# Patient Record
Sex: Male | Born: 1980 | Race: Black or African American | Hispanic: No | Marital: Single
Health system: Southern US, Community
[De-identification: ages and names within clinical notes are randomized; demographics above are authoritative.]

---

## 2018-07-20 ENCOUNTER — Emergency Department (HOSPITAL_COMMUNITY): Payer: Self-pay

## 2018-07-20 ENCOUNTER — Emergency Department (HOSPITAL_COMMUNITY)
Admission: EM | Admit: 2018-07-20 | Discharge: 2018-07-20 | Disposition: A | Discharge status: Home / Self Care | Payer: Self-pay | Attending: Emergency Medicine | Admitting: Emergency Medicine

## 2018-07-20 ENCOUNTER — Encounter (HOSPITAL_COMMUNITY): Payer: Self-pay

## 2018-07-20 ENCOUNTER — Other Ambulatory Visit: Payer: Self-pay

## 2018-07-20 DIAGNOSIS — S92215A Nondisplaced fracture of cuboid bone of left foot, initial encounter for closed fracture: Secondary | ICD-10-CM

## 2018-07-20 DIAGNOSIS — W1842XA Slipping, tripping and stumbling without falling due to stepping into hole or opening, initial encounter: Secondary | ICD-10-CM | POA: Insufficient documentation

## 2018-07-20 DIAGNOSIS — Y929 Unspecified place or not applicable: Secondary | ICD-10-CM | POA: Insufficient documentation

## 2018-07-20 DIAGNOSIS — Y999 Unspecified external cause status: Secondary | ICD-10-CM | POA: Insufficient documentation

## 2018-07-20 DIAGNOSIS — Y939 Activity, unspecified: Secondary | ICD-10-CM | POA: Insufficient documentation

## 2018-07-20 MED ORDER — NAPROXEN 375 MG PO TABS: 375.0000 mg | ORAL_TABLET | Freq: Two times a day (BID) | ORAL | 0 refills | Status: AC

## 2018-07-20 MED ORDER — IBUPROFEN 800 MG PO TABS
800.0000 mg | ORAL_TABLET | Freq: Once | ORAL | Status: AC
  Administered 2018-07-20: 800 mg via ORAL
  Filled 2018-07-20: qty 1

## 2018-07-20 NOTE — ED Triage Notes (Addendum)
Pt BIB EMS from home. Pts left foot fell into a hole this morning and twisted his foot. Swelling and slight deformity noted. Pt has strong pedal pulses. Pt reports pain is radiating up his leg. Pt was tested for COVID and was told he was negative yesterday.   170/92 HR 90 RR 18 99% RA Temp 98.1

## 2018-07-20 NOTE — Consult Note (Addendum)
Reason for Consult: Left cuboid fracture Referring Physician: Francine Graven, DO   HPI: Patient is a history significant for smoking, here for evaluation of left foot injury.  The patient reports that he was in his yard walking when he stepped in a hole and twisted his left foot.  He describes an inversion type injury.  He notes an immediate sharp stabbing sensation in his left foot.  He points to his lateral midfoot as his source of pain.  He notes that he was unable to bear weight due to pain.  Nonweightbearing help to alleviate his symptoms.  He then presented to the [facility] emergency department for evaluation.  X-rays were obtained that demonstrate a nondisplaced intra-articular cuboid fracture.  Dr. Wylene Simmer was then consulted.  A CT scan was then ordered.  The patient reports that he has not ever injured this foot previously.  He denies any prior surgical interventions to his left foot.  He notes that his pain has not really improved.  He states that he lives alone.  He reports that he works at a [employer].  He has questions and concerns over the severity of his injury.  Past medical history: The patient admits to smoking 3-4 cigarettes daily.The patient denies cardiac related events, diabetes, thyroid disease, other autoimmune disorders, and cancers.2  History reviewed. No pertinent past medical history.  History reviewed. No pertinent surgical history.  History reviewed. No pertinent family history.  Social History:  has no history on file for tobacco, alcohol, and drug.  Allergies:  Allergies  Allergen Reactions  . Penicillins Rash    Did it involve swelling of the face/tongue/throat, SOB, or low BP? Yes Did it involve sudden or severe rash/hives, skin peeling, or any reaction on the inside of your mouth or nose? No Did you need to seek medical attention at a hospital or doctor's office? Yes When did it last happen?"about 5 years ago If all above answers are  "NO", may proceed with cephalosporin use.   . Shellfish Allergy Rash    Medications: I have reviewed the patient's current medications.  No results found for this or any previous visit (from the past 48 hour(s)).  Dg Ankle Complete Left  Result Date: 07/20/2018 CLINICAL DATA:  [place].  Injured left ankle. EXAM: LEFT ANKLE COMPLETE - 3+ VIEW COMPARISON:  None. FINDINGS: The ankle mortise is maintained. No acute ankle fracture is identified. No osteochondral lesion. No definite ankle joint effusion. Cuboid fracture is again demonstrated. IMPRESSION: 1. No definite ankle fractures. 2. Cuboid fracture suspected as seen on foot films. Electronically Signed   By: Marijo Sanes M.D.   On: 07/20/2018 11:24   Ct Foot Left Wo Contrast  Result Date: 07/20/2018 CLINICAL DATA:  History of cuboid fracture.  Foot pain. EXAM: CT OF THE LEFT FOOT WITHOUT CONTRAST TECHNIQUE: Multidetector CT imaging of the left foot was performed according to the standard protocol. Multiplanar CT image reconstructions were also generated. COMPARISON:  None. FINDINGS: Bones/Joint/Cartilage Severely comminuted cuboid fracture with fracture cleft involving the articular surface of the calcaneocuboid joint and 4/5 tarsometatarsal joints. Small nondisplaced fracture at the tip of the anterior process of the calcaneus. No other acute fracture or dislocation. Normal alignment. Lisfranc joint is in anatomic alignment. Ankle mortise is normal. Ligaments Ligaments are suboptimally evaluated by CT. Muscles and Tendons Muscles are normal. No muscle atrophy. No intramuscular fluid collection or hematoma. Flexor, extensor, peroneal and Achilles tendons are intact. Soft tissue No fluid collection or hematoma.  No soft tissue mass. IMPRESSION: 1. Severely comminuted cuboid fracture with fracture cleft involving the articular surface of the calcaneocuboid joint and 4/5 tarsometatarsal joints. 2. Small nondisplaced fracture at the tip of the anterior  process of the calcaneus. Electronically Signed   By: Elige KoHetal  Patel   On: 07/20/2018 13:35   Dg Foot Complete Left  Result Date: 07/20/2018 CLINICAL DATA:  Left foot pain and swelling. Twisting injury. Fell. EXAM: LEFT FOOT - COMPLETE 3+ VIEW COMPARISON:  None. FINDINGS: The joint spaces are maintained. No arthropathic findings. Findings suspicious for a nondisplaced fracture of the cuboid. No other definite foot fractures are identified. IMPRESSION: Nondisplaced intra-articular fracture of the cuboid. CT may be helpful for further evaluation. No other definite fractures are identified. Electronically Signed   By: Rudie MeyerP.  Gallerani M.D.   On: 07/20/2018 11:23    ROS:   Gen: Denies fever, chills, weight change, fatigue, night sweats MSK: patient complains of left lateral foot pain with any type of range of motion or attempt at weightbearing. Neuro: Denies headache, numbness, weakness, slurred speech, loss of memory or consciousness Derm: Denies rash, dry skin, scaling or peeling skin change HEENT: Denies blurred vision, double vision, neck stiffness, dysphagia PULM: Denies shortness of breath, cough, sputum production, hemoptysis, wheezing CV: Denies chest pain, edema, orthopnea, palpitations GI: Denies abdominal pain, nausea, vomiting, diarrhea, hematochezia, melena, constipation  Endocrine: Denies hot or cold intolerance, polyuria, polyphagia or appetite change Heme: Denies easy bruising, bleeding, bleeding gums  PE:  General: WDWN patient in NAD. Psych:  Appropriate mood and affect. Neuro:  A&O x 3, Moving all extremities, sensation intact to light touch HEENT:  EOMs intact Chest:  Even non-labored respirations Skin:  Incision C/D/I, no rashes or lesions Extremities: warm/dry, no edema, erythema or echymosis.  No lymphadenopathy. Pulses: Popliteus 2+ MSK:  Patient is guarded on exam.  Tender to palpation, localized over the cubiod.  Tender to palpation over the sinus tarsi.  ROM:  dorsiflexion 5 shy of neutral, plantar flexion 20, inversion and eversion are 0 15 respectively., ZOX:WRUEAVMT:unable to assess MMT due to pain. Patient is able to perform quad set, (-) Homan's  Blood pressure 128/75, pulse 67, temperature 98.9 F (37.2 C), temperature source Oral, resp. rate 16, SpO2 100 %.   Assessment/Plan: Nondisplaced L comminuted intraarticular cuboid fracture  Dr. Victorino DikeHewitt and I reviewed the plain films and CT scan.  For the time being the fracture pattern has maintained adequate anatomic alignment.  We will proceed with conservative treatment.  Plan to make the patient nonweightbearing in a posterior splint with side stirrups.  Follow-up in the office in 10-14 days for reassessment and repeat weightbearing plain films.  I spoke with Arthor CaptainAbigail Harris, PA-C in regards to this plan.  I did briefly discuss the adverse affects of smoking as it pertains to bone healing.  Alfredo MartinezJustin Fronie Holstein PA-C [facility] Office:  [phone]

## 2018-07-20 NOTE — ED Notes (Signed)
Patient transported to X-ray 

## 2018-07-20 NOTE — ED Notes (Signed)
Bed: KF84 Expected date:  Expected time:  Means of arrival:  Comments: EMS deformity/fall

## 2018-07-20 NOTE — ED Notes (Signed)
Patient transported to CT 

## 2018-07-20 NOTE — Discharge Instructions (Addendum)
Wylene Simmer, MD [facility]  Please read the following information regarding your care.  Medications   Weight Bearing X Do not bear any weight on the injured leg or foot.  Cast / Splint / Dressing X Keep your splint, cast or dressing clean and dry.  Dont put anything (coat hanger, pencil, etc) down inside of it.  If it gets damp, use a hair dryer on the cool setting to dry it.  If it gets soaked, call the office to schedule an appointment for a cast change.   Swelling It is normal for you to have swelling where you had injury.  To reduce swelling and pain, keep your toes above your nose for at least 3 days.  It may be necessary to keep your foot or leg elevated for several weeks.  If it hurts, it should be elevated.  Follow Up Call my office at [phone] when you are discharged from the hospital to schedule an appointment to be seen in 10 - 14 days.

## 2018-07-20 NOTE — ED Provider Notes (Signed)
[facility] DEPT Provider Note   CSN: [id number] Arrival date & time: 07/20/18  1015     History   Chief Complaint Chief Complaint  Patient presents with  . Foot Injury    HPI Jeremiah Wilkinson is a 38 y.o. male who presents emergency department chief complaint of left foot pain.  Patient was ambulating this morning when he accidentally stepped in a hole. Patient had an inversion injury to the left foot and ankle.  He had immediate severe pain on the dorsum of the left foot.  He is unable to bear weight immediately after the injury.  He has had swelling and pain to the area.  No obvious deformities.  He denies numbness or tingling.  He has no previous injuries to the affected ankle.     HPI  History reviewed. No pertinent past medical history.  There are no active problems to display for this patient.   History reviewed. No pertinent surgical history.      Home Medications    Prior to Admission medications   Not on File    Family History History reviewed. No pertinent family history.  Social History Social History   Tobacco Use  . Smoking status: Not on file  Substance Use Topics  . Alcohol use: Not on file  . Drug use: Not on file     Allergies   Penicillins and Shellfish allergy   Review of Systems Review of Systems Ten systems reviewed and are negative for acute change, except as noted in the HPI.    Physical Exam Updated Vital Signs BP 128/75 (BP Location: Left Arm)   Pulse 67   Temp 98.9 F (37.2 C) (Oral)   Resp 16   SpO2 100%   Physical Exam  Physical Exam  Nursing note and vitals reviewed. Constitutional: He appears well-developed and well-nourished. No distress.  HENT:  Head: Normocephalic and atraumatic.  Eyes: Conjunctivae normal are normal. No scleral icterus.  Neck: Normal range of motion. Neck supple.  Cardiovascular: Normal rate, regular rhythm and normal heart sounds.   Pulmonary/Chest: Effort  normal and breath sounds normal. No respiratory distress.  Abdominal: Soft. There is no tenderness.  Musculoskeletal: Left foot and ankle exam shows swelling over the proximal lateral portion of the foot on the dorsal surface.  Is exquisitely tender to palpation.  No swelling over the medial or lateral malleolus.  He has normal DP and PT pulses.  No swelling or posterior tibial pain.  No pain along the entire surface of the foot, no bruising along the plantar surface.  Full range of motion of the toes, pain with movement of the ankle. Neurological: He is alert.  Skin: Skin is warm and dry. He is not diaphoretic.  Psychiatric: His behavior is normal.    ED Treatments / Results  Labs (all labs ordered are listed, but only abnormal results are displayed) Labs Reviewed - No data to display  EKG    Radiology Dg Ankle Complete Left  Result Date: 07/20/2018 CLINICAL DATA:  [place] [address].  Injured left ankle. EXAM: LEFT ANKLE COMPLETE - 3+ VIEW COMPARISON:  None. FINDINGS: The ankle mortise is maintained. No acute ankle fracture is identified. No osteochondral lesion. No definite ankle joint effusion. Cuboid fracture is again demonstrated. IMPRESSION: 1. No definite ankle fractures. 2. Cuboid fracture suspected as seen on foot films. Electronically Signed   By: Marijo Sanes M.D.   On: 07/20/2018 11:24   Dg Foot Complete Left  Result Date: 07/20/2018  CLINICAL DATA:  Left foot pain and swelling. Twisting injury. Fell. EXAM: LEFT FOOT - COMPLETE 3+ VIEW COMPARISON:  None. FINDINGS: The joint spaces are maintained. No arthropathic findings. Findings suspicious for a nondisplaced fracture of the cuboid. No other definite foot fractures are identified. IMPRESSION: Nondisplaced intra-articular fracture of the cuboid. CT may be helpful for further evaluation. No other definite fractures are identified. Electronically Signed   By: Rudie MeyerP.  Gallerani M.D.   On: 07/20/2018 11:23    Procedures Procedures (including  critical care time) SPLINT APPLICATION Date/Time: 4:15 PM Authorized by: Arthor CaptainAbigail Terrence Wishon Consent: Verbal consent obtained. Risks and benefits: risks, benefits and alternatives were discussed Consent given by: patient Splint applied by: orthopedic technician Location details: Left leg Splint type: Short leg (stirrup and posterior) Supplies used: Ortho glass Post-procedure: The splinted body part was neurovascularly unchanged following the procedure. Patient tolerance: Patient tolerated the procedure well with no immediate complications.    Medications Ordered in ED Medications - No data to display   Initial Impression / Assessment and Plan / ED Course  I have reviewed the triage vital signs and the nursing notes.  Pertinent labs & imaging results that were available during my care of the patient were reviewed by me and considered in my medical decision making (see chart for details).        38 year old male with left foot injury.  I have reviewed the patient's plain film of the left foot and ankle which shows a cuboid fracture which is intra-articular.  I spoke with Dr. Victorino DikeHewitt who asked that I get a CT.  CT reviewed by myself as well as Alfredo MartinezJustin Ollis PA for Dr. Victorino DikeHewitt who has seen the patient here in the emergency department and consult.  The patient will be placed in a short leg splint.  He is to maintain nonweightbearing.  He was given crutches.  Prescription for anti-inflammatories.  He is advised to follow-up with Dr. Victorino DikeHewitt in his office in 14 days.  I discussed return precautions.  Home care.  He appears appropriate for discharge at this time.  Final Clinical Impressions(s) / ED Diagnoses   Final diagnoses:  None    ED Discharge Orders    None       Arthor CaptainHarris, Bevely Hackbart, PA-C 07/20/18 1615    Samuel JesterMcManus, Kathleen, DO 07/23/18 1110

## 2020-08-08 IMAGING — CR LEFT FOOT - COMPLETE 3+ VIEW
3 series · 3 of 3 positions shown · non-contrast
Comparison: None.

CLINICAL DATA: Left foot pain and swelling. Twisting injury. Fell.

EXAM:
LEFT FOOT - COMPLETE 3+ VIEW

[x foot lat left]
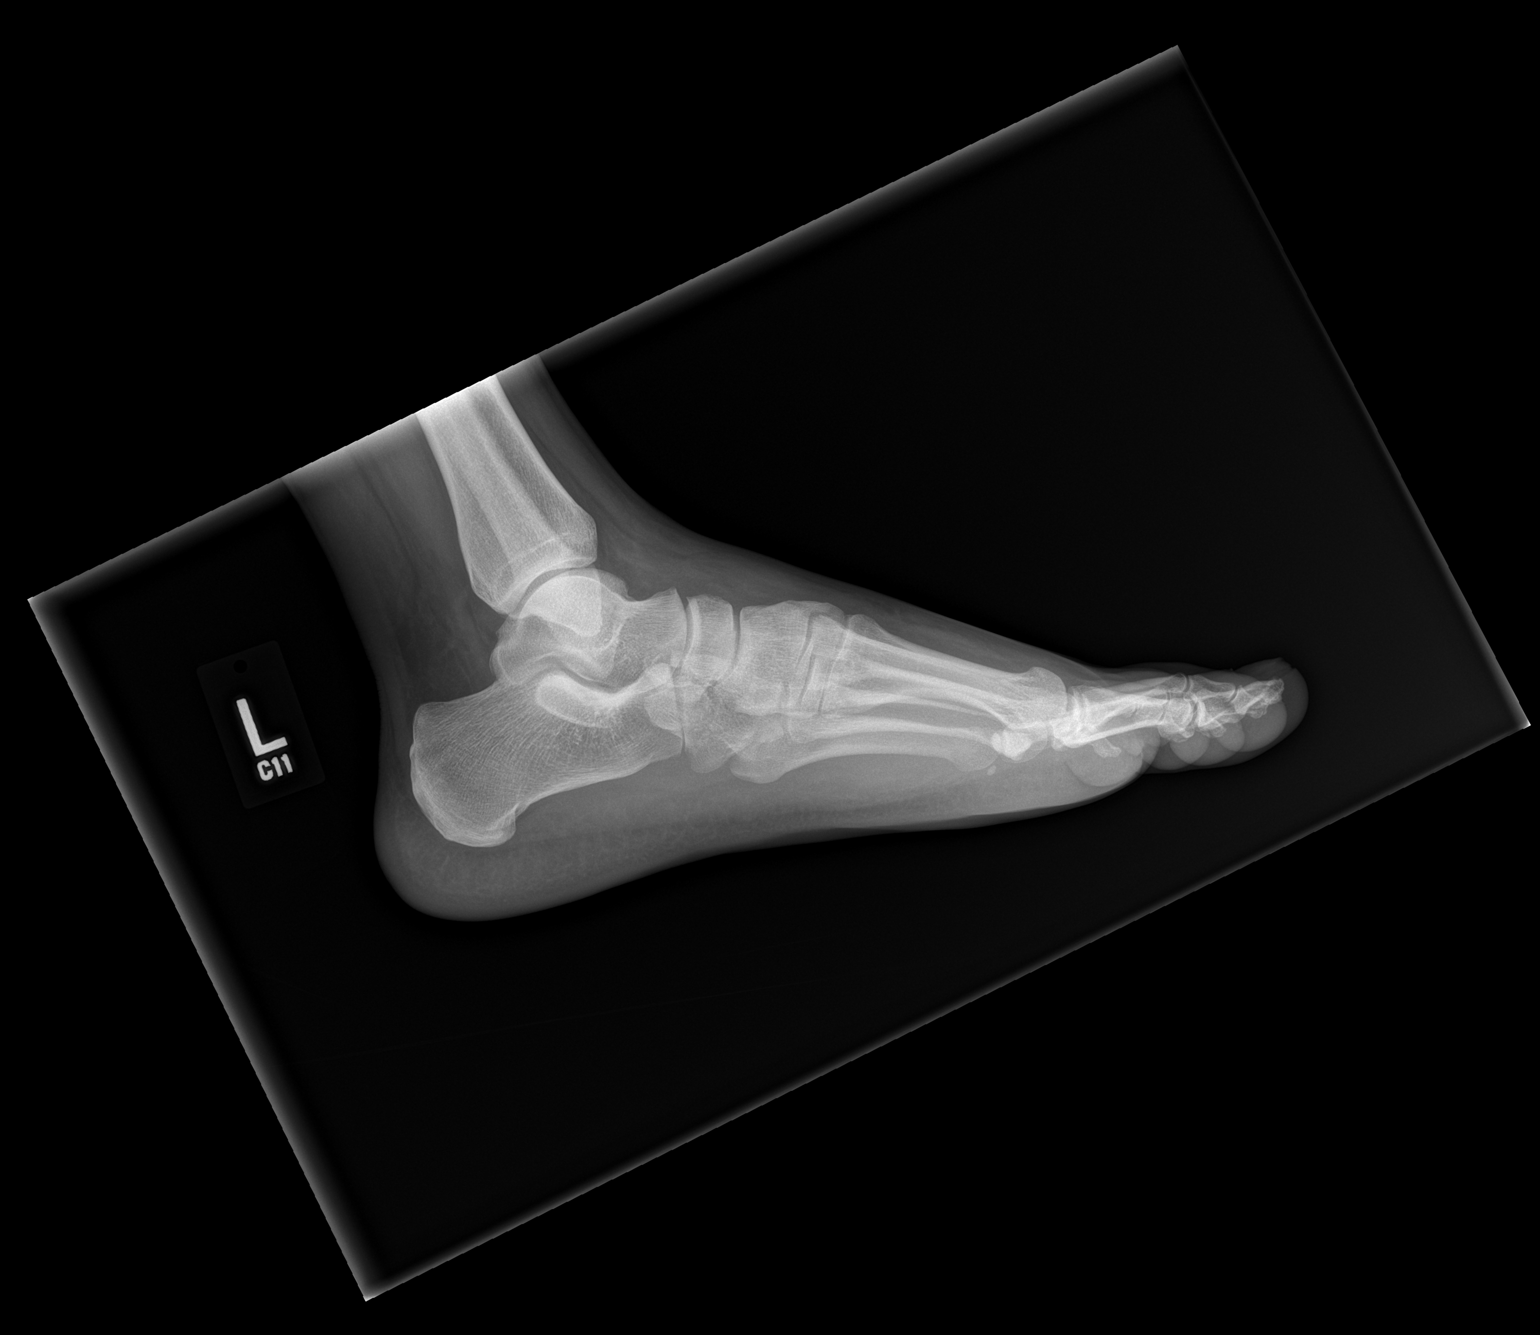

[x foot ap left]
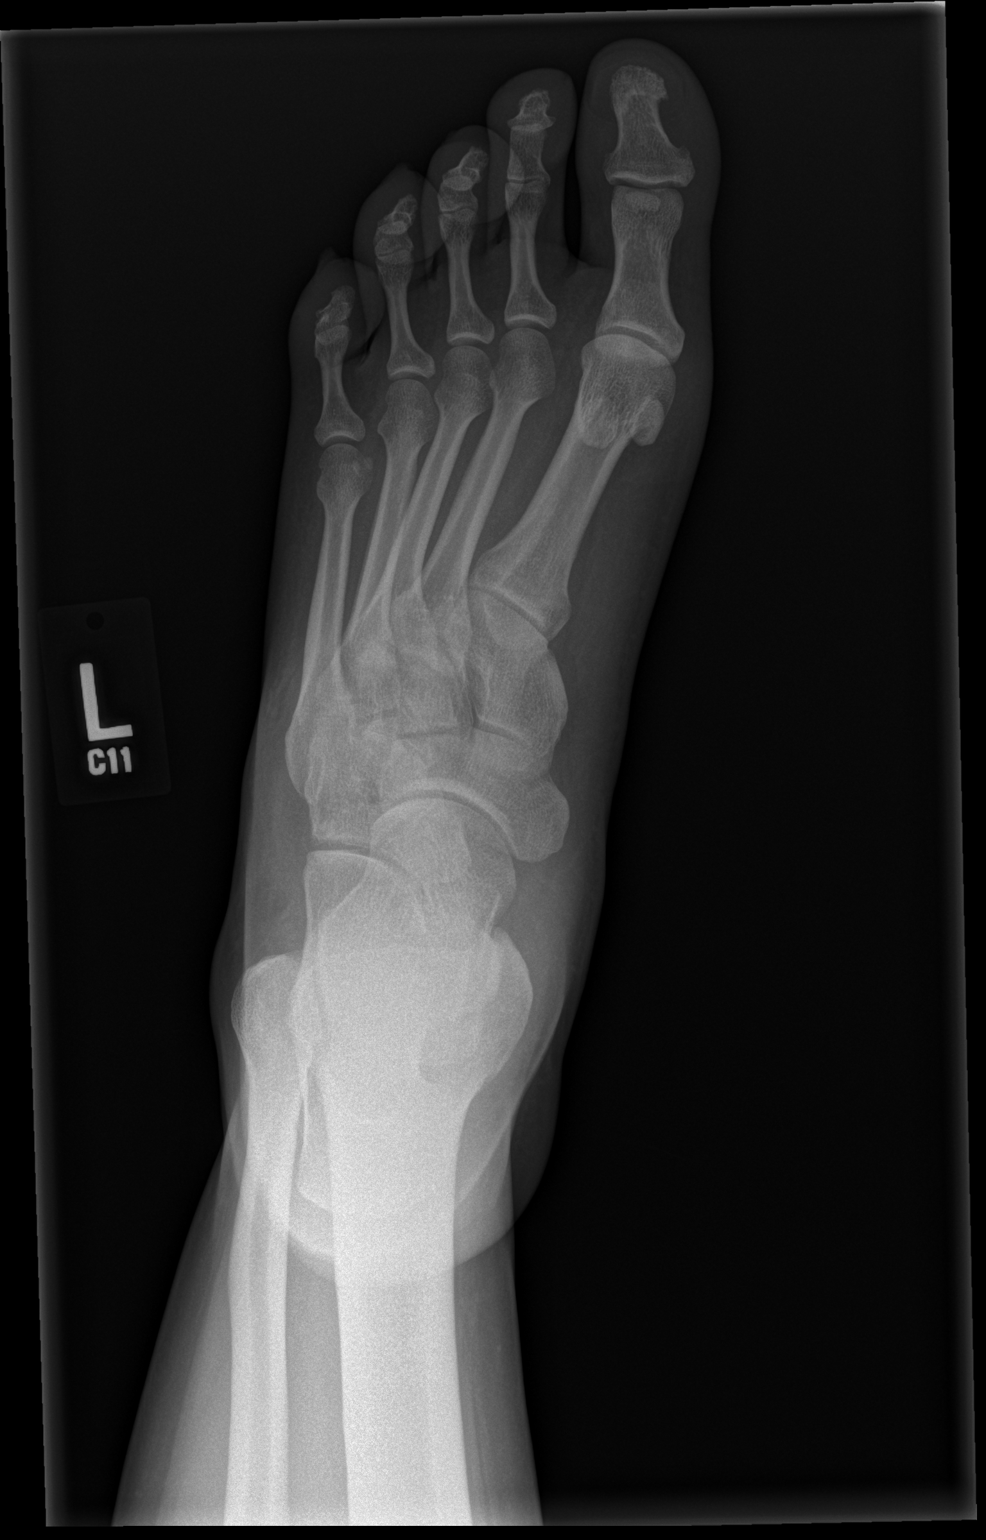

[x foot obl left]
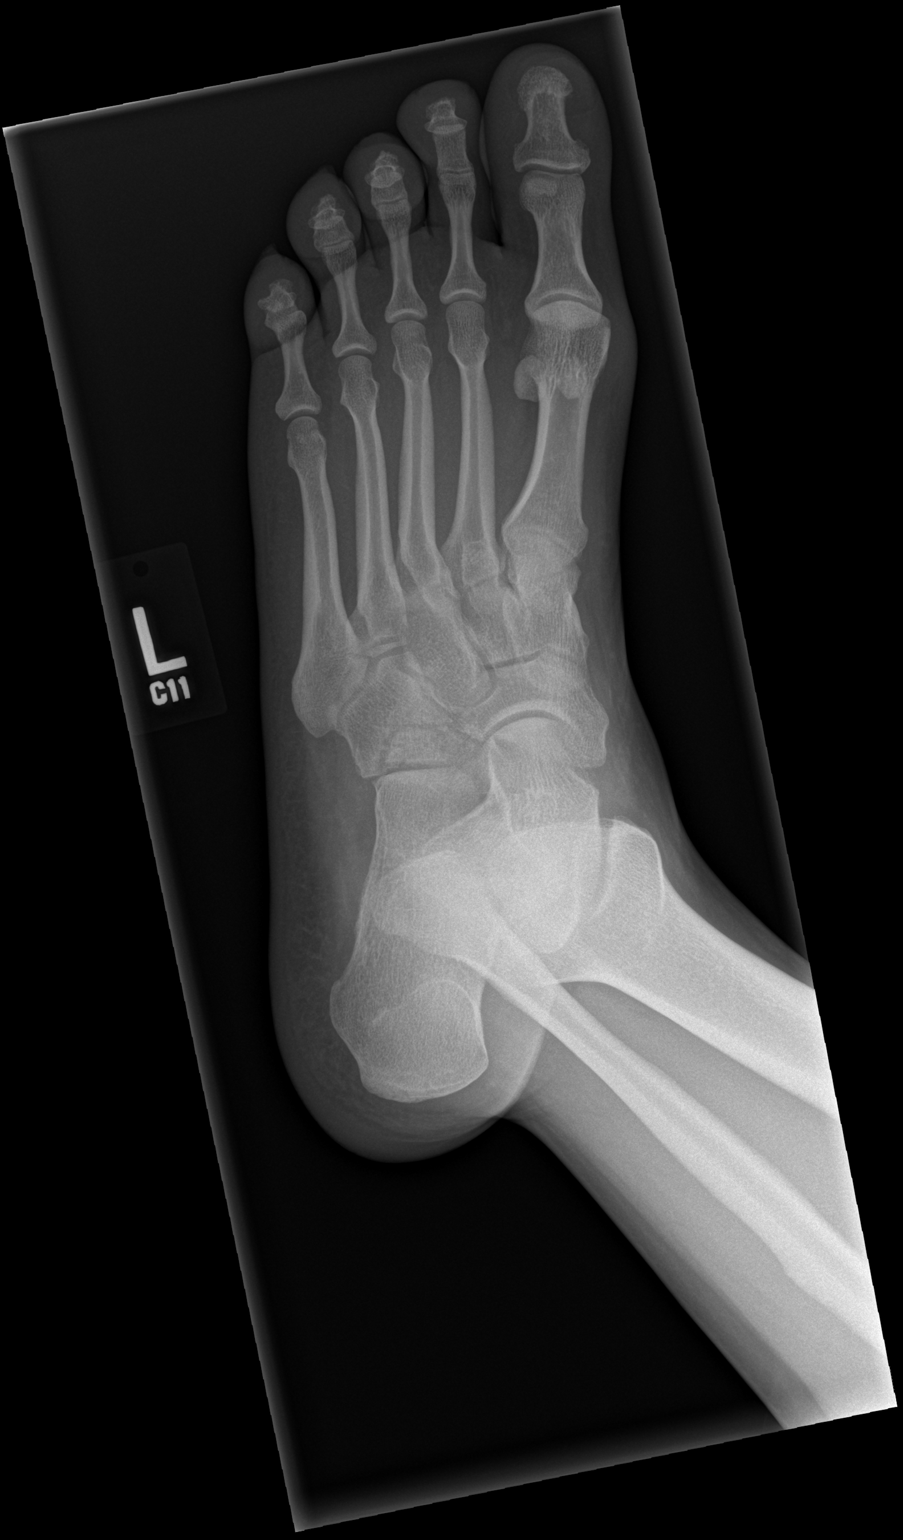

[3 of 3 positions shown; findings below may reference images not displayed]

FINDINGS: The joint spaces are maintained. No arthropathic findings. Findings
suspicious for a nondisplaced fracture of the cuboid. No other
definite foot fractures are identified.
IMPRESSION: Nondisplaced intra-articular fracture of the cuboid. CT may be
helpful for further evaluation. No other definite fractures are
identified.

## 2020-08-08 IMAGING — CT CT OF THE LEFT FOOT WITHOUT CONTRAST
3 series · 12 of 34 positions shown, 14 images · non-contrast
Comparison: None.

CLINICAL DATA: History of cuboid fracture.  Foot pain.

EXAM:
CT OF THE LEFT FOOT WITHOUT CONTRAST
TECHNIQUE: Multidetector CT imaging of the left foot was performed according to
the standard protocol. Multiplanar CT image reconstructions were
also generated.

[Series 4: axial st · axial · 0.55mm/px · z∈[+390,+526]mm · 4 of 133 slices shown, 5 images]
[im 21/133  soft-tissue]
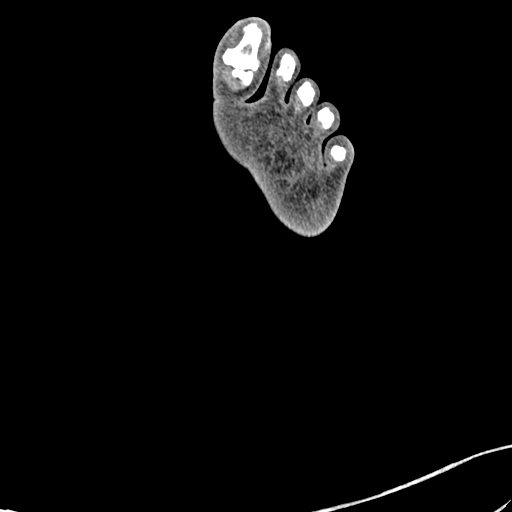
[im 21/133  bone]
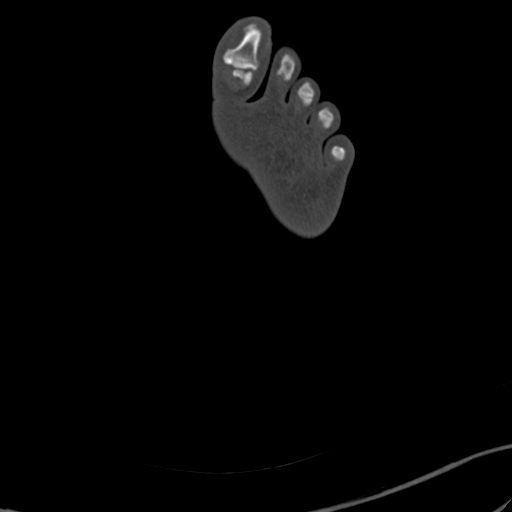
[im 51/133  bone]
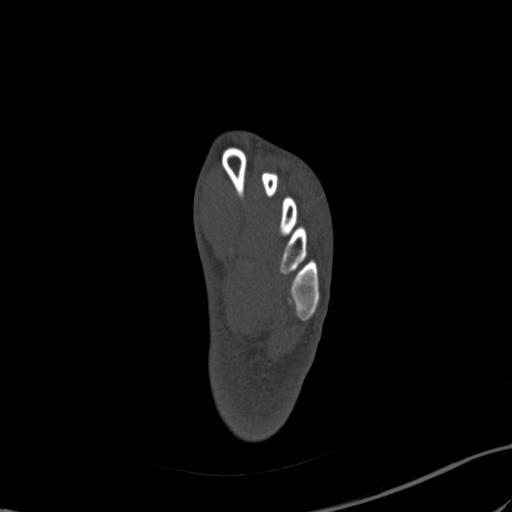
[im 82/133  bone]
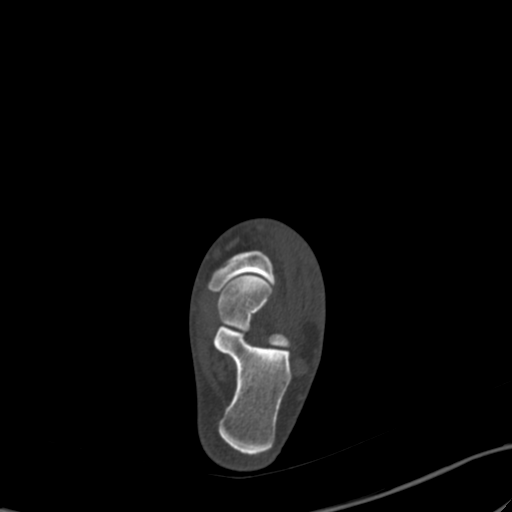
[im 112/133  bone]
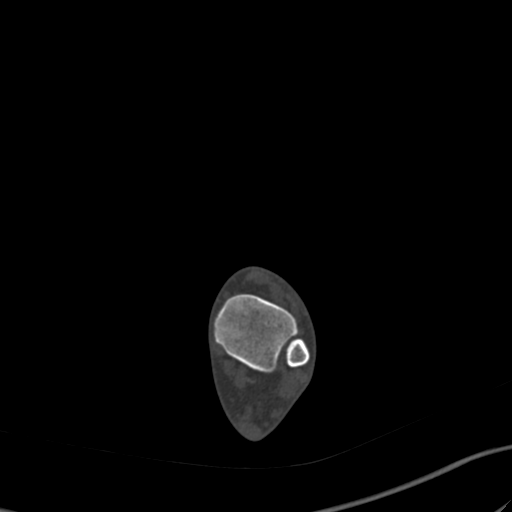

[Series 7: coronal bone · coronal · 0.34mm/px · 3 of 157 slices shown]
[im 64/157  bone]
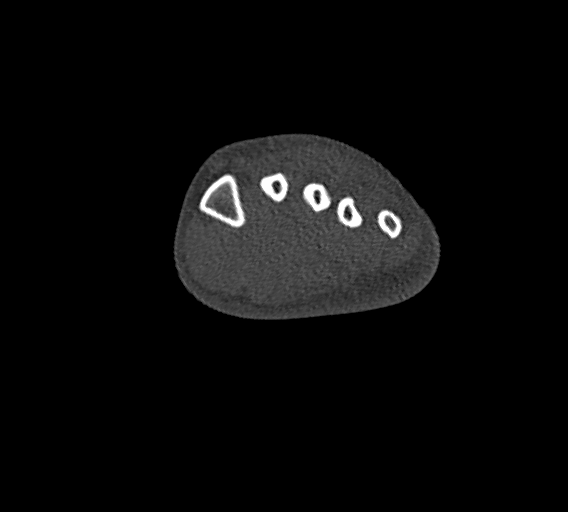
[im 74/157  bone]
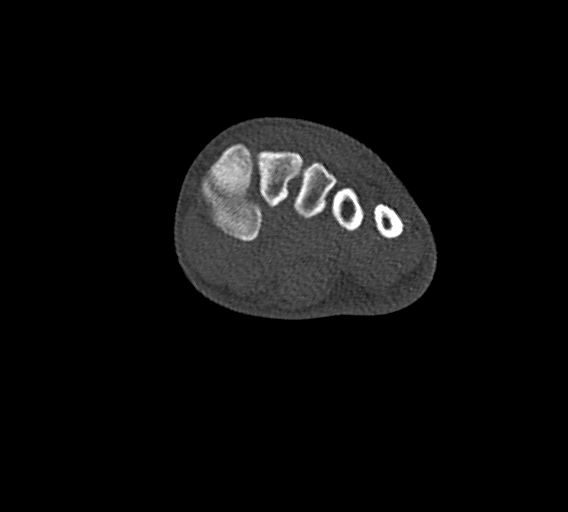
[im 83/157  bone]
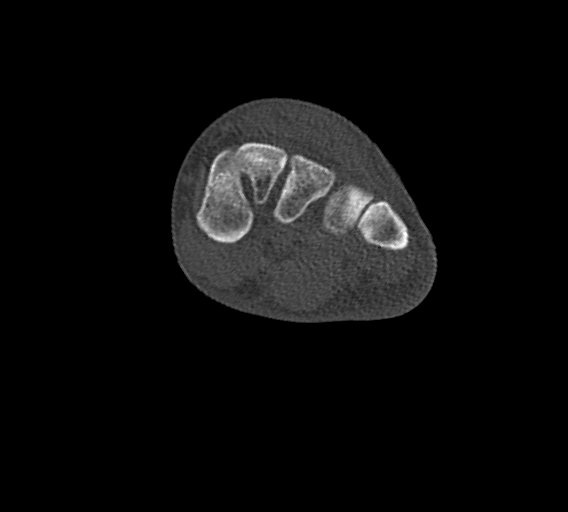

[Series 8: sagittal bone · sagittal · 0.34mm/px · 5 of 97 slices shown, 6 images]
[im 37/97  bone]
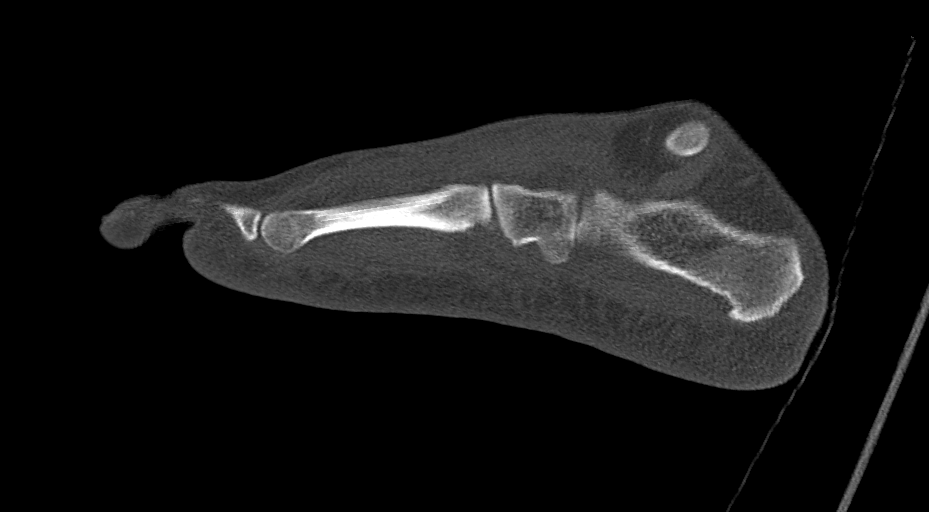
[im 43/97  bone]
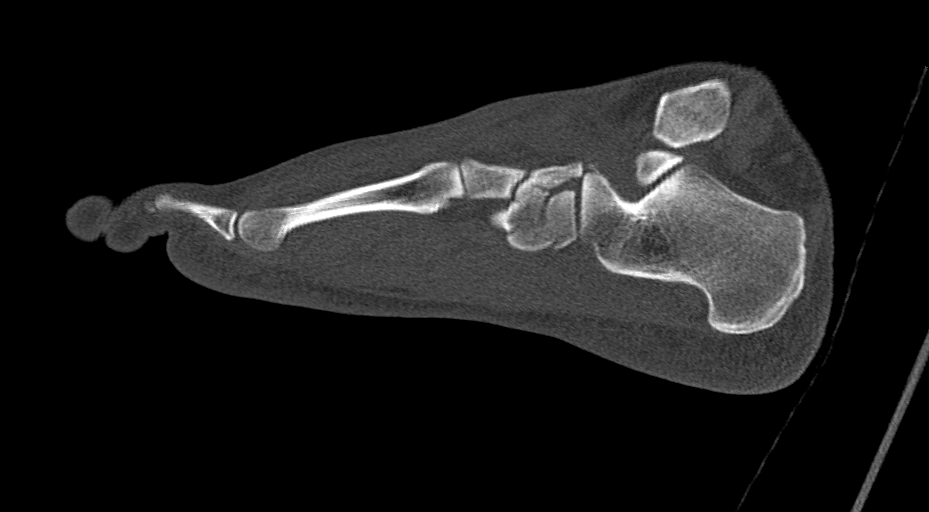
[im 49/97  soft-tissue]
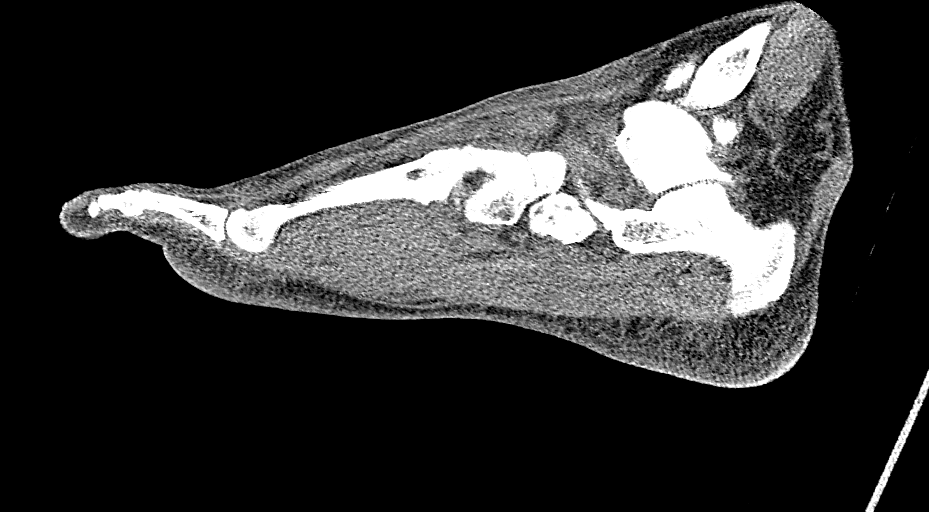
[im 49/97  bone]
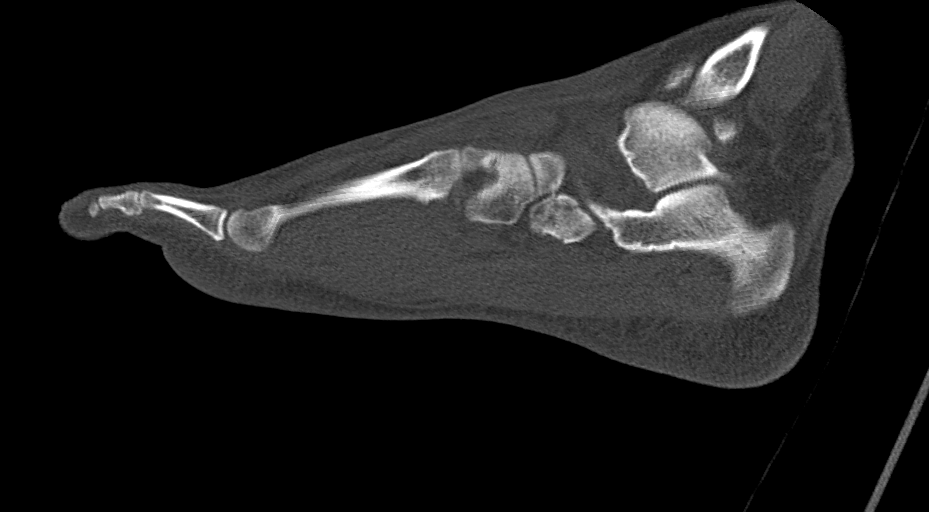
[im 54/97  bone]
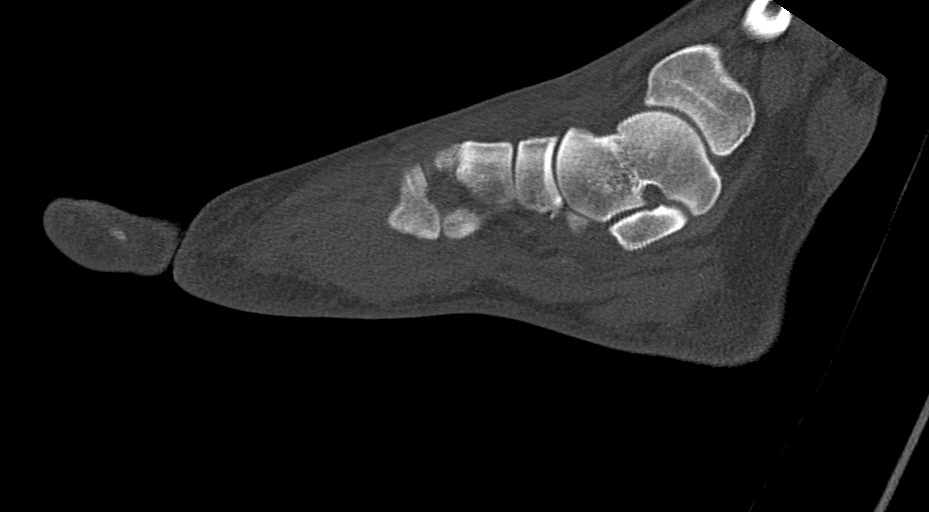
[im 60/97  bone]
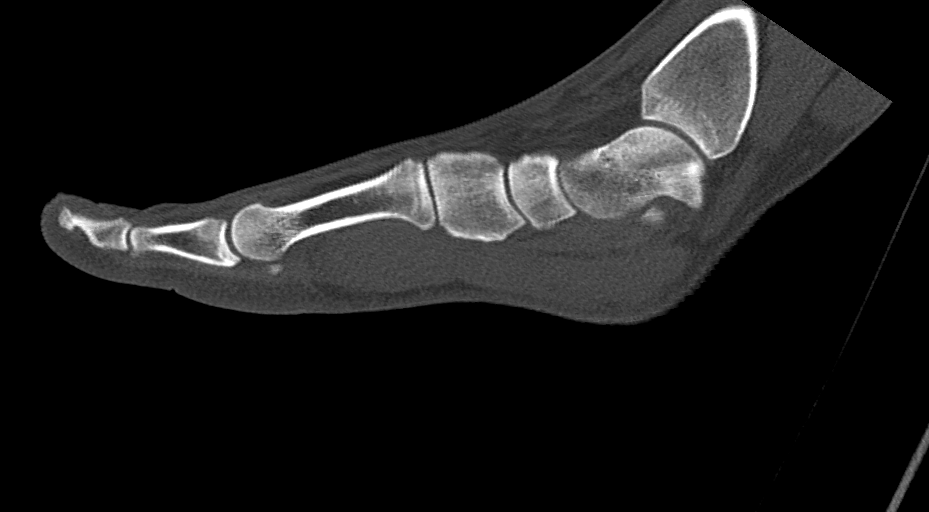

[12 of 34 positions shown; findings below may reference images not displayed]

FINDINGS: Bones/Joint/Cartilage

Severely comminuted cuboid fracture with fracture cleft involving
the articular surface of the calcaneocuboid joint and [DATE]
tarsometatarsal joints.

Small nondisplaced fracture at the tip of the anterior process of
the calcaneus.

No other acute fracture or dislocation. Normal alignment. Lisfranc
joint is in anatomic alignment. Ankle mortise is normal.

Ligaments

Ligaments are suboptimally evaluated by CT.

Muscles and Tendons
Muscles are normal. No muscle atrophy. No intramuscular fluid
collection or hematoma. Flexor, extensor, peroneal and Achilles
tendons are intact.

Soft tissue
No fluid collection or hematoma.  No soft tissue mass.
IMPRESSION: 1. Severely comminuted cuboid fracture with fracture cleft involving
the articular surface of the calcaneocuboid joint and [DATE]
tarsometatarsal joints.
2. Small nondisplaced fracture at the tip of the anterior process of
the calcaneus.
